# Patient Record
Sex: Male | Born: 1980 | Race: Black or African American | Hispanic: No | Smoking: Current every day smoker
Health system: Southern US, Community
[De-identification: ages and names within clinical notes are randomized; demographics above are authoritative.]

## PROBLEM LIST (undated history)

## (undated) DIAGNOSIS — E119 Type 2 diabetes mellitus without complications: Secondary | ICD-10-CM

## (undated) DIAGNOSIS — E7800 Pure hypercholesterolemia, unspecified: Secondary | ICD-10-CM

## (undated) DIAGNOSIS — N19 Unspecified kidney failure: Secondary | ICD-10-CM

## (undated) DIAGNOSIS — I10 Essential (primary) hypertension: Secondary | ICD-10-CM

## (undated) HISTORY — PX: GASTROSTOMY TUBE PLACEMENT: SHX655

## (undated) HISTORY — PX: OTHER SURGICAL HISTORY: SHX169

---

## 2015-03-28 ENCOUNTER — Ambulatory Visit: Payer: Self-pay | Admitting: General Surgery

## 2015-04-06 ENCOUNTER — Emergency Department
Admission: EM | Admit: 2015-04-06 | Discharge: 2015-04-06 | Disposition: A | Discharge status: Home / Self Care | Payer: Medicare Other | Attending: Emergency Medicine | Admitting: Emergency Medicine

## 2015-04-06 ENCOUNTER — Emergency Department: Payer: Medicare Other

## 2015-04-06 ENCOUNTER — Encounter: Payer: Self-pay | Admitting: Emergency Medicine

## 2015-04-06 DIAGNOSIS — Y9389 Activity, other specified: Secondary | ICD-10-CM | POA: Diagnosis not present

## 2015-04-06 DIAGNOSIS — Y92129 Unspecified place in nursing home as the place of occurrence of the external cause: Secondary | ICD-10-CM | POA: Insufficient documentation

## 2015-04-06 DIAGNOSIS — Y998 Other external cause status: Secondary | ICD-10-CM | POA: Diagnosis not present

## 2015-04-06 DIAGNOSIS — S8001XA Contusion of right knee, initial encounter: Secondary | ICD-10-CM | POA: Diagnosis not present

## 2015-04-06 DIAGNOSIS — E119 Type 2 diabetes mellitus without complications: Secondary | ICD-10-CM | POA: Diagnosis not present

## 2015-04-06 DIAGNOSIS — F17200 Nicotine dependence, unspecified, uncomplicated: Secondary | ICD-10-CM | POA: Diagnosis not present

## 2015-04-06 DIAGNOSIS — S8991XA Unspecified injury of right lower leg, initial encounter: Secondary | ICD-10-CM | POA: Diagnosis present

## 2015-04-06 DIAGNOSIS — I10 Essential (primary) hypertension: Secondary | ICD-10-CM | POA: Insufficient documentation

## 2015-04-06 HISTORY — DX: Type 2 diabetes mellitus without complications: E11.9

## 2015-04-06 HISTORY — DX: Pure hypercholesterolemia, unspecified: E78.00

## 2015-04-06 HISTORY — DX: Essential (primary) hypertension: I10

## 2015-04-06 HISTORY — DX: Unspecified kidney failure: N19

## 2015-04-06 MED ORDER — HYDROCODONE-ACETAMINOPHEN 5-325 MG PO TABS
2.0000 | ORAL_TABLET | Freq: Once | ORAL | Status: AC
  Administered 2015-04-06: 2 via ORAL
  Filled 2015-04-06: qty 2
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
## 2015-04-11 ENCOUNTER — Ambulatory Visit: Payer: Medicaid Other | Admitting: General Surgery

## 2015-05-26 ENCOUNTER — Encounter: Payer: Self-pay | Admitting: *Deleted
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
